# Patient Record
Sex: Female | Born: 1985 | Race: White | Hispanic: No | Marital: Married | Smoking: Never smoker
Health system: Southern US, Community
[De-identification: ages and names within clinical notes are randomized; demographics above are authoritative.]

## PROBLEM LIST (undated history)

## (undated) DIAGNOSIS — R519 Headache, unspecified: Secondary | ICD-10-CM

## (undated) DIAGNOSIS — F32A Depression, unspecified: Secondary | ICD-10-CM

## (undated) DIAGNOSIS — G43909 Migraine, unspecified, not intractable, without status migrainosus: Secondary | ICD-10-CM

## (undated) DIAGNOSIS — R87619 Unspecified abnormal cytological findings in specimens from cervix uteri: Secondary | ICD-10-CM

## (undated) DIAGNOSIS — R51 Headache: Secondary | ICD-10-CM

## (undated) DIAGNOSIS — F329 Major depressive disorder, single episode, unspecified: Secondary | ICD-10-CM

## (undated) DIAGNOSIS — F419 Anxiety disorder, unspecified: Secondary | ICD-10-CM

## (undated) DIAGNOSIS — E785 Hyperlipidemia, unspecified: Secondary | ICD-10-CM

## (undated) DIAGNOSIS — B009 Herpesviral infection, unspecified: Secondary | ICD-10-CM

## (undated) HISTORY — DX: Anxiety disorder, unspecified: F41.9

## (undated) HISTORY — DX: Headache, unspecified: R51.9

## (undated) HISTORY — DX: Hyperlipidemia, unspecified: E78.5

## (undated) HISTORY — DX: Herpesviral infection, unspecified: B00.9

## (undated) HISTORY — DX: Unspecified abnormal cytological findings in specimens from cervix uteri: R87.619

## (undated) HISTORY — PX: WISDOM TOOTH EXTRACTION: SHX21

## (undated) HISTORY — PX: TYMPANOSTOMY TUBE PLACEMENT: SHX32

## (undated) HISTORY — DX: Major depressive disorder, single episode, unspecified: F32.9

## (undated) HISTORY — DX: Headache: R51

## (undated) HISTORY — DX: Depression, unspecified: F32.A

---
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
## 2012-12-04 HISTORY — PX: FACIAL FRACTURE SURGERY: SHX1570

## 2016-07-09 ENCOUNTER — Ambulatory Visit
Admission: EM | Admit: 2016-07-09 | Discharge: 2016-07-09 | Disposition: A | Discharge status: Home / Self Care | Payer: 59 | Attending: Family Medicine | Admitting: Family Medicine

## 2016-07-09 ENCOUNTER — Ambulatory Visit (INDEPENDENT_AMBULATORY_CARE_PROVIDER_SITE_OTHER): Payer: 59

## 2016-07-09 DIAGNOSIS — S9031XA Contusion of right foot, initial encounter: Secondary | ICD-10-CM

## 2016-07-09 DIAGNOSIS — S99921A Unspecified injury of right foot, initial encounter: Secondary | ICD-10-CM | POA: Diagnosis not present

## 2016-07-09 DIAGNOSIS — S93601A Unspecified sprain of right foot, initial encounter: Secondary | ICD-10-CM | POA: Diagnosis not present

## 2016-07-09 HISTORY — DX: Migraine, unspecified, not intractable, without status migrainosus: G43.909

## 2016-07-09 MED ORDER — MELOXICAM 15 MG PO TABS: 15.0000 mg | ORAL_TABLET | Freq: Every day | ORAL | 0 refills | Status: DC

## 2016-07-09 NOTE — ED Triage Notes (Signed)
Patient states that she was outside with her dogs last night and twisted just her foot. Patient states that others around her also heard a cracking sound. Patient has swelling and bruising on her foot. Patient states that area hurts to ambulate but does improve with compression.
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
## 2016-07-24 DIAGNOSIS — H5213 Myopia, bilateral: Secondary | ICD-10-CM | POA: Diagnosis not present

## 2016-08-24 DIAGNOSIS — Z01419 Encounter for gynecological examination (general) (routine) without abnormal findings: Secondary | ICD-10-CM | POA: Diagnosis not present

## 2016-08-24 DIAGNOSIS — Z8619 Personal history of other infectious and parasitic diseases: Secondary | ICD-10-CM | POA: Diagnosis not present

## 2016-08-24 DIAGNOSIS — Z124 Encounter for screening for malignant neoplasm of cervix: Secondary | ICD-10-CM | POA: Diagnosis not present

## 2016-08-24 DIAGNOSIS — N920 Excessive and frequent menstruation with regular cycle: Secondary | ICD-10-CM | POA: Diagnosis not present

## 2016-08-25 DIAGNOSIS — Z01419 Encounter for gynecological examination (general) (routine) without abnormal findings: Secondary | ICD-10-CM | POA: Diagnosis not present

## 2016-09-06 DIAGNOSIS — M9901 Segmental and somatic dysfunction of cervical region: Secondary | ICD-10-CM | POA: Diagnosis not present

## 2016-09-06 DIAGNOSIS — M9902 Segmental and somatic dysfunction of thoracic region: Secondary | ICD-10-CM | POA: Diagnosis not present

## 2016-09-06 DIAGNOSIS — M5414 Radiculopathy, thoracic region: Secondary | ICD-10-CM | POA: Diagnosis not present

## 2016-09-06 DIAGNOSIS — G43001 Migraine without aura, not intractable, with status migrainosus: Secondary | ICD-10-CM | POA: Diagnosis not present

## 2016-09-07 DIAGNOSIS — M9901 Segmental and somatic dysfunction of cervical region: Secondary | ICD-10-CM | POA: Diagnosis not present

## 2016-09-07 DIAGNOSIS — M5414 Radiculopathy, thoracic region: Secondary | ICD-10-CM | POA: Diagnosis not present

## 2016-09-07 DIAGNOSIS — M9902 Segmental and somatic dysfunction of thoracic region: Secondary | ICD-10-CM | POA: Diagnosis not present

## 2016-09-07 DIAGNOSIS — G43001 Migraine without aura, not intractable, with status migrainosus: Secondary | ICD-10-CM | POA: Diagnosis not present

## 2016-09-11 DIAGNOSIS — M5414 Radiculopathy, thoracic region: Secondary | ICD-10-CM | POA: Diagnosis not present

## 2016-09-11 DIAGNOSIS — M9902 Segmental and somatic dysfunction of thoracic region: Secondary | ICD-10-CM | POA: Diagnosis not present

## 2016-09-11 DIAGNOSIS — M9901 Segmental and somatic dysfunction of cervical region: Secondary | ICD-10-CM | POA: Diagnosis not present

## 2016-09-11 DIAGNOSIS — G43001 Migraine without aura, not intractable, with status migrainosus: Secondary | ICD-10-CM | POA: Diagnosis not present

## 2016-09-13 DIAGNOSIS — M9901 Segmental and somatic dysfunction of cervical region: Secondary | ICD-10-CM | POA: Diagnosis not present

## 2016-09-13 DIAGNOSIS — G43001 Migraine without aura, not intractable, with status migrainosus: Secondary | ICD-10-CM | POA: Diagnosis not present

## 2016-09-13 DIAGNOSIS — M9902 Segmental and somatic dysfunction of thoracic region: Secondary | ICD-10-CM | POA: Diagnosis not present

## 2016-09-13 DIAGNOSIS — M5414 Radiculopathy, thoracic region: Secondary | ICD-10-CM | POA: Diagnosis not present

## 2016-09-19 DIAGNOSIS — M5414 Radiculopathy, thoracic region: Secondary | ICD-10-CM | POA: Diagnosis not present

## 2016-09-19 DIAGNOSIS — M9901 Segmental and somatic dysfunction of cervical region: Secondary | ICD-10-CM | POA: Diagnosis not present

## 2016-09-19 DIAGNOSIS — M9902 Segmental and somatic dysfunction of thoracic region: Secondary | ICD-10-CM | POA: Diagnosis not present

## 2016-09-19 DIAGNOSIS — G43001 Migraine without aura, not intractable, with status migrainosus: Secondary | ICD-10-CM | POA: Diagnosis not present

## 2016-09-21 DIAGNOSIS — M9901 Segmental and somatic dysfunction of cervical region: Secondary | ICD-10-CM | POA: Diagnosis not present

## 2016-09-21 DIAGNOSIS — M5414 Radiculopathy, thoracic region: Secondary | ICD-10-CM | POA: Diagnosis not present

## 2016-09-21 DIAGNOSIS — M9902 Segmental and somatic dysfunction of thoracic region: Secondary | ICD-10-CM | POA: Diagnosis not present

## 2016-09-21 DIAGNOSIS — G43001 Migraine without aura, not intractable, with status migrainosus: Secondary | ICD-10-CM | POA: Diagnosis not present

## 2016-09-25 DIAGNOSIS — M9901 Segmental and somatic dysfunction of cervical region: Secondary | ICD-10-CM | POA: Diagnosis not present

## 2016-09-25 DIAGNOSIS — M9902 Segmental and somatic dysfunction of thoracic region: Secondary | ICD-10-CM | POA: Diagnosis not present

## 2016-09-25 DIAGNOSIS — M5414 Radiculopathy, thoracic region: Secondary | ICD-10-CM | POA: Diagnosis not present

## 2016-09-25 DIAGNOSIS — G43001 Migraine without aura, not intractable, with status migrainosus: Secondary | ICD-10-CM | POA: Diagnosis not present

## 2016-09-29 DIAGNOSIS — M9902 Segmental and somatic dysfunction of thoracic region: Secondary | ICD-10-CM | POA: Diagnosis not present

## 2016-09-29 DIAGNOSIS — M5414 Radiculopathy, thoracic region: Secondary | ICD-10-CM | POA: Diagnosis not present

## 2016-09-29 DIAGNOSIS — G43001 Migraine without aura, not intractable, with status migrainosus: Secondary | ICD-10-CM | POA: Diagnosis not present

## 2016-09-29 DIAGNOSIS — M9901 Segmental and somatic dysfunction of cervical region: Secondary | ICD-10-CM | POA: Diagnosis not present

## 2016-10-02 DIAGNOSIS — G43001 Migraine without aura, not intractable, with status migrainosus: Secondary | ICD-10-CM | POA: Diagnosis not present

## 2016-10-02 DIAGNOSIS — M5414 Radiculopathy, thoracic region: Secondary | ICD-10-CM | POA: Diagnosis not present

## 2016-10-02 DIAGNOSIS — M9901 Segmental and somatic dysfunction of cervical region: Secondary | ICD-10-CM | POA: Diagnosis not present

## 2016-10-02 DIAGNOSIS — M9902 Segmental and somatic dysfunction of thoracic region: Secondary | ICD-10-CM | POA: Diagnosis not present

## 2016-10-05 DIAGNOSIS — M9902 Segmental and somatic dysfunction of thoracic region: Secondary | ICD-10-CM | POA: Diagnosis not present

## 2016-10-05 DIAGNOSIS — M5414 Radiculopathy, thoracic region: Secondary | ICD-10-CM | POA: Diagnosis not present

## 2016-10-05 DIAGNOSIS — M9901 Segmental and somatic dysfunction of cervical region: Secondary | ICD-10-CM | POA: Diagnosis not present

## 2016-10-05 DIAGNOSIS — G43001 Migraine without aura, not intractable, with status migrainosus: Secondary | ICD-10-CM | POA: Diagnosis not present

## 2016-10-12 DIAGNOSIS — G43001 Migraine without aura, not intractable, with status migrainosus: Secondary | ICD-10-CM | POA: Diagnosis not present

## 2016-10-12 DIAGNOSIS — M9902 Segmental and somatic dysfunction of thoracic region: Secondary | ICD-10-CM | POA: Diagnosis not present

## 2016-10-12 DIAGNOSIS — M5414 Radiculopathy, thoracic region: Secondary | ICD-10-CM | POA: Diagnosis not present

## 2016-10-12 DIAGNOSIS — M9901 Segmental and somatic dysfunction of cervical region: Secondary | ICD-10-CM | POA: Diagnosis not present

## 2017-04-18 DIAGNOSIS — Z8742 Personal history of other diseases of the female genital tract: Secondary | ICD-10-CM | POA: Diagnosis not present

## 2017-08-31 DIAGNOSIS — Z01419 Encounter for gynecological examination (general) (routine) without abnormal findings: Secondary | ICD-10-CM | POA: Diagnosis not present

## 2017-08-31 DIAGNOSIS — N6081 Other benign mammary dysplasias of right breast: Secondary | ICD-10-CM | POA: Diagnosis not present

## 2017-10-10 DIAGNOSIS — R7989 Other specified abnormal findings of blood chemistry: Secondary | ICD-10-CM | POA: Diagnosis not present

## 2017-10-10 DIAGNOSIS — R5383 Other fatigue: Secondary | ICD-10-CM | POA: Diagnosis not present

## 2017-10-10 DIAGNOSIS — R5381 Other malaise: Secondary | ICD-10-CM | POA: Diagnosis not present

## 2017-10-10 DIAGNOSIS — N6081 Other benign mammary dysplasias of right breast: Secondary | ICD-10-CM | POA: Diagnosis not present

## 2017-10-11 DIAGNOSIS — R7989 Other specified abnormal findings of blood chemistry: Secondary | ICD-10-CM | POA: Diagnosis not present

## 2017-10-11 DIAGNOSIS — R5383 Other fatigue: Secondary | ICD-10-CM | POA: Diagnosis not present

## 2017-10-11 DIAGNOSIS — N6081 Other benign mammary dysplasias of right breast: Secondary | ICD-10-CM | POA: Diagnosis not present

## 2017-10-11 DIAGNOSIS — R5381 Other malaise: Secondary | ICD-10-CM | POA: Diagnosis not present

## 2017-10-18 DIAGNOSIS — R768 Other specified abnormal immunological findings in serum: Secondary | ICD-10-CM | POA: Diagnosis not present

## 2017-12-06 ENCOUNTER — Other Ambulatory Visit
Admission: RE | Admit: 2017-12-06 | Discharge: 2017-12-06 | Disposition: A | Discharge status: Home / Self Care | Payer: 59 | Source: Ambulatory Visit | Attending: Internal Medicine | Admitting: Internal Medicine

## 2017-12-06 DIAGNOSIS — E039 Hypothyroidism, unspecified: Secondary | ICD-10-CM | POA: Insufficient documentation

## 2017-12-06 DIAGNOSIS — E785 Hyperlipidemia, unspecified: Secondary | ICD-10-CM | POA: Diagnosis not present

## 2017-12-06 DIAGNOSIS — R5383 Other fatigue: Secondary | ICD-10-CM | POA: Diagnosis not present

## 2017-12-06 LAB — T4, FREE: FREE T4: 0.78 ng/dL (ref 0.61–1.12)

## 2017-12-07 LAB — THYROID ANTIBODIES
Thyroglobulin Antibody: 48.9 IU/mL — ABNORMAL HIGH (ref 0.0–0.9)
Thyroperoxidase Ab SerPl-aCnc: 16 IU/mL (ref 0–34)

## 2017-12-07 LAB — TSH: TSH: 1.192 u[IU]/mL (ref 0.350–4.500)

## 2017-12-07 LAB — T3, FREE: T3 FREE: 3.6 pg/mL (ref 2.0–4.4)

## 2017-12-25 DIAGNOSIS — R5383 Other fatigue: Secondary | ICD-10-CM | POA: Diagnosis not present

## 2018-02-26 DIAGNOSIS — H5210 Myopia, unspecified eye: Secondary | ICD-10-CM | POA: Diagnosis not present

## 2018-02-26 DIAGNOSIS — H5213 Myopia, bilateral: Secondary | ICD-10-CM | POA: Diagnosis not present

## 2018-02-26 DIAGNOSIS — H1045 Other chronic allergic conjunctivitis: Secondary | ICD-10-CM | POA: Diagnosis not present

## 2018-05-14 IMAGING — CR DG FOOT COMPLETE 3+V*R*
3 series · 3 of 3 positions shown · non-contrast
Comparison: None.

CLINICAL DATA: Injury to base of fifth metatarsal bone.

EXAM:
RIGHT FOOT COMPLETE - 3+ VIEW

[foot ap]
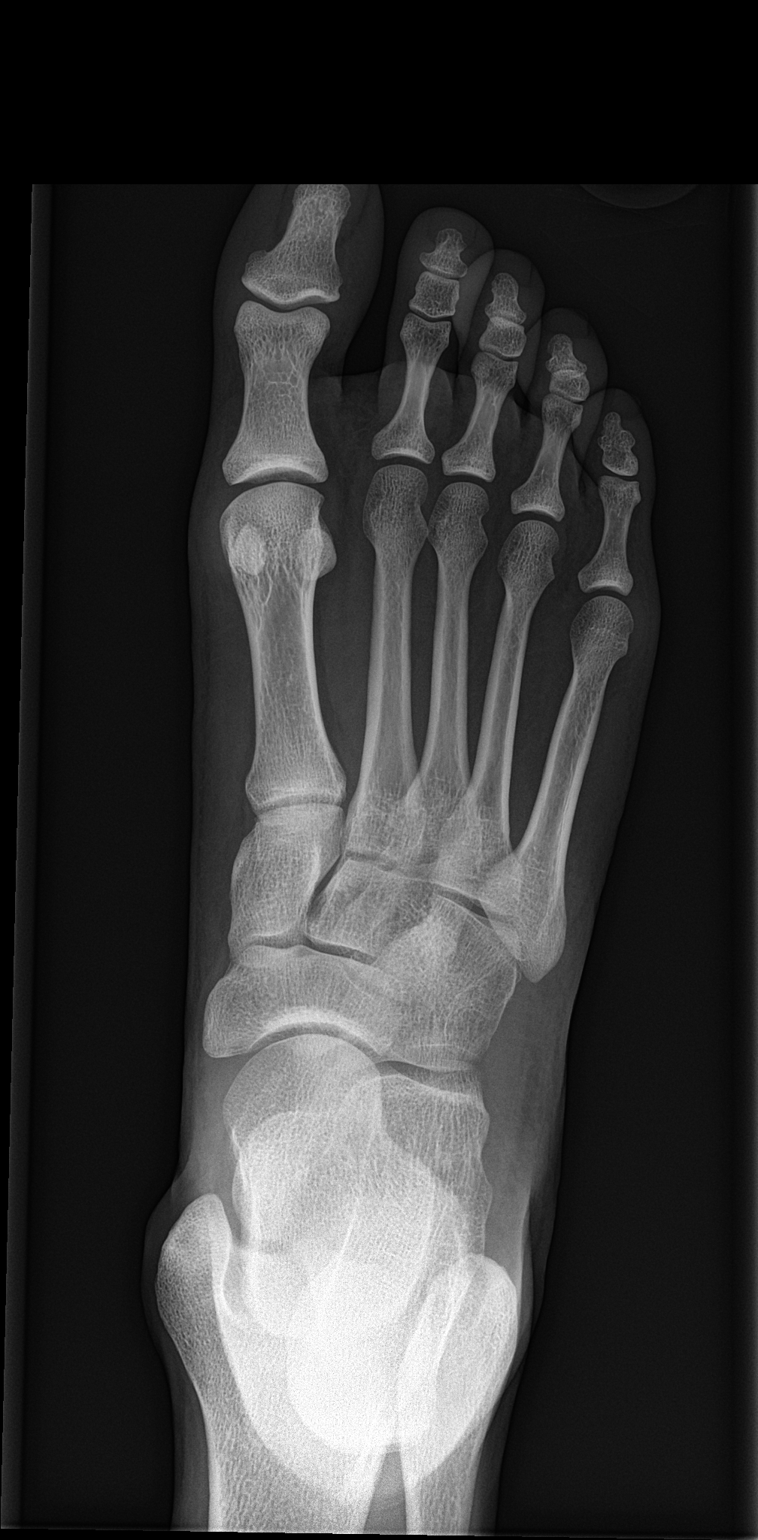

[foot obl]
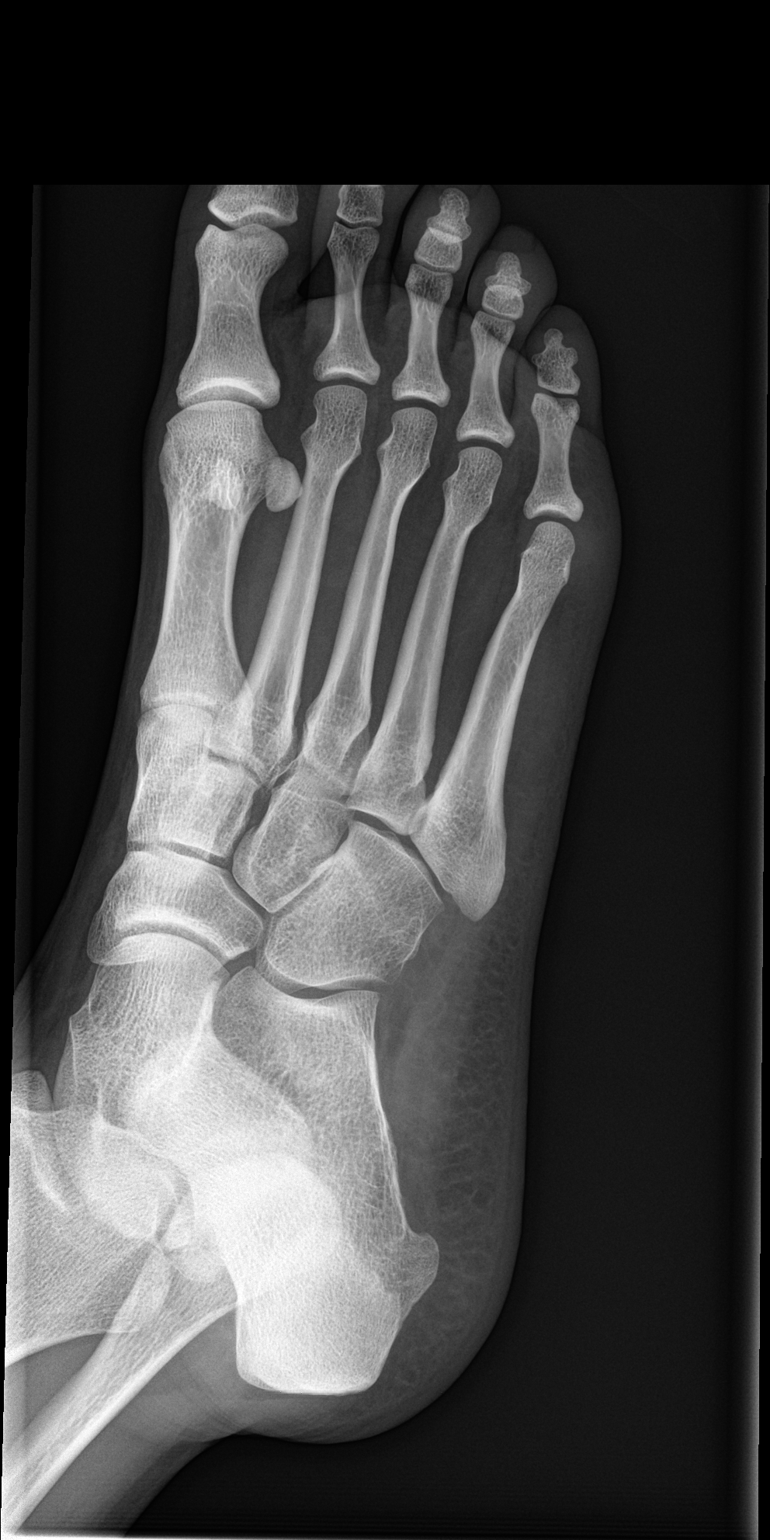

[foot lat]
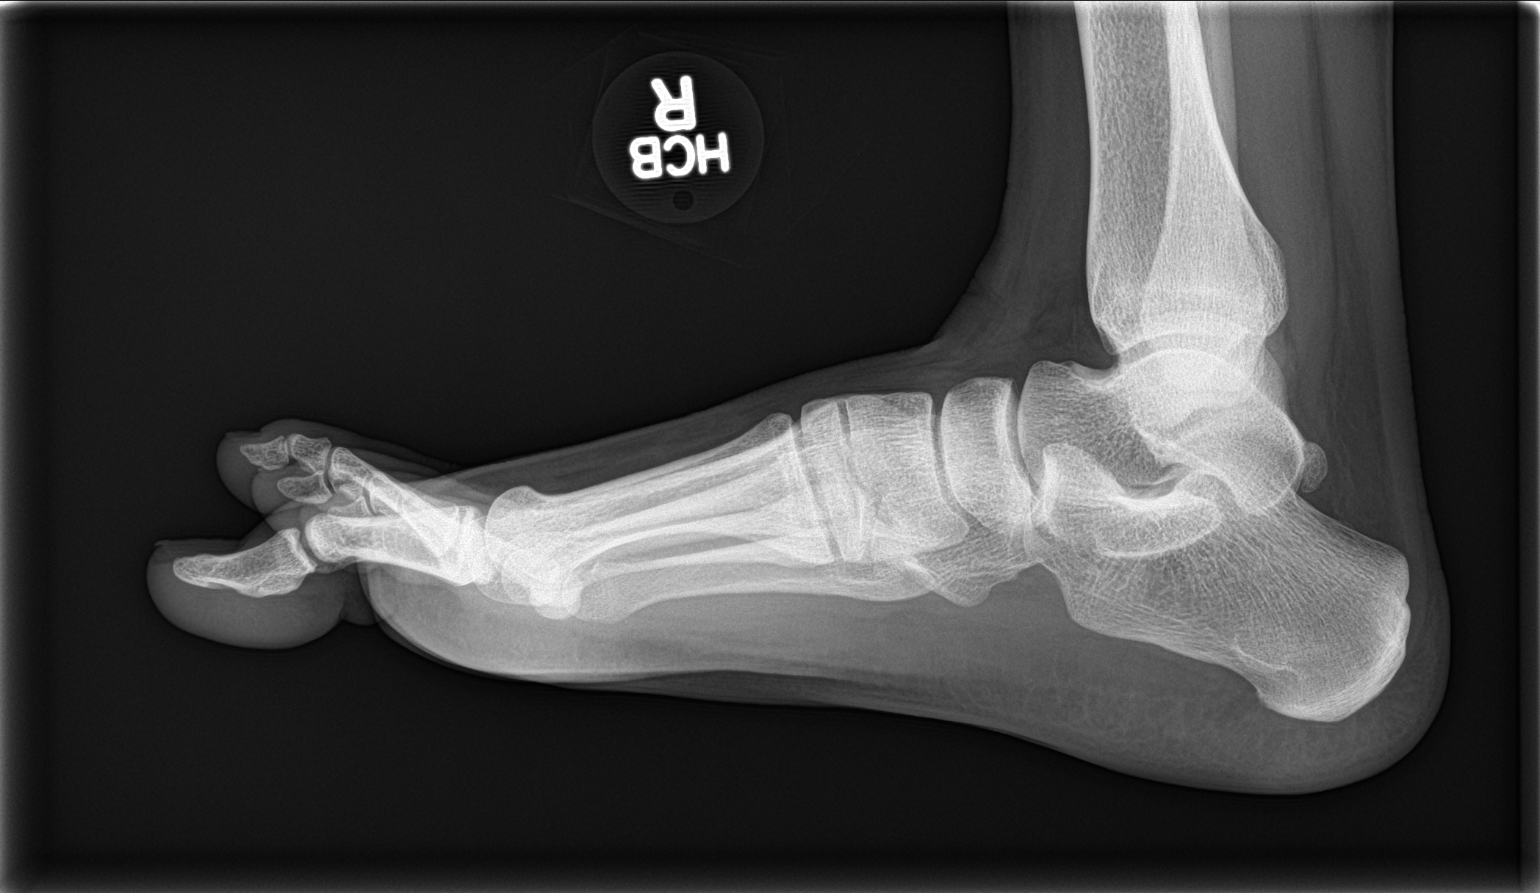

[3 of 3 positions shown; findings below may reference images not displayed]

FINDINGS: There is no evidence of fracture or dislocation. There is no
evidence of arthropathy or other focal bone abnormality. Soft
tissues are unremarkable.
IMPRESSION: Negative.

## 2018-07-16 ENCOUNTER — Ambulatory Visit (INDEPENDENT_AMBULATORY_CARE_PROVIDER_SITE_OTHER): Payer: 59

## 2018-07-16 ENCOUNTER — Other Ambulatory Visit: Payer: Self-pay

## 2018-07-16 ENCOUNTER — Ambulatory Visit
Admission: EM | Admit: 2018-07-16 | Discharge: 2018-07-16 | Disposition: A | Discharge status: Home / Self Care | Payer: 59 | Attending: Family Medicine | Admitting: Family Medicine

## 2018-07-16 ENCOUNTER — Encounter: Payer: Self-pay | Admitting: Emergency Medicine

## 2018-07-16 DIAGNOSIS — B9689 Other specified bacterial agents as the cause of diseases classified elsewhere: Secondary | ICD-10-CM

## 2018-07-16 DIAGNOSIS — N760 Acute vaginitis: Secondary | ICD-10-CM

## 2018-07-16 DIAGNOSIS — M25562 Pain in left knee: Secondary | ICD-10-CM

## 2018-07-16 DIAGNOSIS — B3731 Acute candidiasis of vulva and vagina: Secondary | ICD-10-CM

## 2018-07-16 DIAGNOSIS — B373 Candidiasis of vulva and vagina: Secondary | ICD-10-CM

## 2018-07-16 LAB — WET PREP, GENITAL
Sperm: NONE SEEN
Trich, Wet Prep: NONE SEEN

## 2018-07-16 MED ORDER — METRONIDAZOLE 500 MG PO TABS: 500.0000 mg | ORAL_TABLET | Freq: Two times a day (BID) | ORAL | 0 refills | Status: DC

## 2018-07-16 MED ORDER — MELOXICAM 15 MG PO TABS: 15.0000 mg | ORAL_TABLET | Freq: Every day | ORAL | 0 refills | Status: DC | PRN

## 2018-07-16 MED ORDER — FLUCONAZOLE 150 MG PO TABS: 150.0000 mg | ORAL_TABLET | Freq: Every day | ORAL | 0 refills | Status: DC

## 2018-07-16 NOTE — Discharge Instructions (Addendum)
Take medication as prescribed. Rest. Drink plenty of fluids. Use knee brace as discussed.   Follow up with orthopedic in one week.   Follow up with your primary care physician this week as needed. Return to Urgent care for new or worsening concerns.

## 2018-07-16 NOTE — ED Triage Notes (Signed)
Pt comes to urgent care today c/o left knee pain. Has been occurring for about 6 weeks. She has been doing more exercise and running. Her knee is swollen and hurts worse when bending her knee. Also she is having vaginal itching with white discharge. Her in law recently opened a pool and she has gotten them due to swimming and taking baths in the past. No recent antibiotic use.

## 2018-07-16 NOTE — ED Provider Notes (Signed)
[facility] ____________________________________________  Time seen: Approximately 7:45 PM  I have reviewed the triage vital signs and the nursing notes.   HISTORY  Chief Complaint Knee Pain (left) and Vaginal Itching   HPI Barbara Hunt is a 32 y.o. female presenting for evaluation of left knee pain that is been present for approximately 6 weeks.  States this has been gradual in occurrence.  States in the same timeframe she is recently started increasing her activity levels, including running more as well as participating in a boot camp.  States the [facility] involves a lot of quick movements and different activities.  States that she has continued to do these exercises at least 3 times a week as well as working her 12-hour shifts at work. Has not rested knee. States pain has been gradually worsening, and in the last several days she has had increased pain with accompanying swelling and popping sensation.  States for the last few days she has had a frequent popping sensation.  No giving out.  No direct fall, direct injury or direct trauma.  Denies abrupt onset.  States pain also present in the medial posterior knee coming around to the front right of the knee.  States pain is currently mild.  States usually first thing in the morning she has no pain, but the pain increases as she continues throughout the day.Pain worse with bending.  Unrelieved with over-the-counter Tylenol and ibuprofen.  Denies pain radiation, paresthesias, thigh pain, calf pain, lower extremity edema.  No recent immobilizations or long trips.  Non-smoker.  Not on oral contraceptives.  No previous blood clots.  Denies recurrent issues to knee in the past.  Has not been evaluated for the same.  Patient also complains of 2 days of whitish vaginal discharge with itching. No dysuria. States frequent swimming recently and current complaints feels consistent with vaginal yeast infection.  Sexually active with the same  partner, declines current pregnancy and declines concerns of STDs.  No alleviating measures attempted.  Denies other aggravating factors.  Denies chest pain, shortness of breath, abdominal pain, dysuria, or rash. Denies recent sickness. Denies recent antibiotic use.   [name], MD (Inactive): PCP Patient's last menstrual period was 06/20/2018.Denies pregnancy.   Past Medical History:  Diagnosis Date  . Migraines     There are no active problems to display for this patient.   Past Surgical History:  Procedure Laterality Date  . FACIAL FRACTURE SURGERY  12/2012  . PELVIC FRACTURE SURGERY  12/2002     No current facility-administered medications for this encounter.   Current Outpatient Medications:  .  fluconazole (DIFLUCAN) 150 MG tablet, Take 1 tablet (150 mg total) by mouth daily. Take one pill orally, then Repeat in one week as needed., Disp: 2 tablet, Rfl: 0 .  meloxicam (MOBIC) 15 MG tablet, Take 1 tablet (15 mg total) by mouth daily as needed., Disp: 10 tablet, Rfl: 0 .  metroNIDAZOLE (FLAGYL) 500 MG tablet, Take 1 tablet (500 mg total) by mouth 2 (two) times daily., Disp: 14 tablet, Rfl: 0  Allergies Patient has no known allergies.  History reviewed. No pertinent family history.  Social History Social History   Tobacco Use  . Smoking status: Never Smoker  . Smokeless tobacco: Never Used  Substance Use Topics  . Alcohol use: No  . Drug use: No    Review of Systems Constitutional: No fever/chills Cardiovascular: Denies chest pain. Respiratory: Denies shortness of breath. Gastrointestinal: No abdominal pain.   Genitourinary:  Negative for dysuria. Musculoskeletal: Negative for back pain. Knee pain. Skin: Negative for rash.  ____________________________________________   PHYSICAL EXAM:  VITAL SIGNS: ED Triage Vitals  Enc Vitals Group     BP 07/16/18 1919 119/79     Pulse Rate 07/16/18 1919 (!) 54     Resp 07/16/18 1919 16     Temp 07/16/18 1919  98.1 F (36.7 C)     Temp Source 07/16/18 1919 Oral     SpO2 07/16/18 1919 100 %     Weight 07/16/18 1918 130 lb (59 kg)     Height 07/16/18 1918 5\' 7"  (1.702 m)     Head Circumference --      Peak Flow --      Pain Score 07/16/18 1917 3     Pain Loc --      Pain Edu? --      Excl. in GC? --     Constitutional: Alert and oriented. Well appearing and in no acute distress. ENT      Head: Normocephalic and atraumatic. Cardiovascular: Normal rate, regular rhythm. Grossly normal heart sounds.  Good peripheral circulation. Respiratory: Normal respiratory effort without tachypnea nor retractions. Breath sounds are clear and equal bilaterally. No wheezes, rales, rhonchi. Gastrointestinal: Soft and nontender. Musculoskeletal:  No midline cervical, thoracic or lumbar tenderness to palpation. Bilateral pedal pulses equal and easily palpated.      Right lower leg:  No tenderness or edema.      Left lower leg:  No tenderness or edema.  Except: Right anterior knee bilateral effusion noted right greater than left, no point bony tenderness, mild pain right anterior noted inferior to patella and medial knee to medial posterior, no posterior edema, no erythema, skin intact, pain with valgus stress, minimal pain with varus stress, negative anterior posterior drawer test, mild pain with resisted knee flexion, no pain with resisted knee extension, no left lower extremity edema otherwise, left lower extremity otherwise nontender.  No pain with bilateral plantarflexion and dorsiflexion. Neurologic:  Normal speech and language. No gross focal neurologic deficits are appreciated. Speech is normal. No gait instability.  Skin:  Skin is warm, dry and intact. No rash noted. Psychiatric: Mood and affect are normal. Speech and behavior are normal. Patient exhibits appropriate insight and judgment   ___________________________________________   LABS (all labs ordered are listed, but only abnormal results are  displayed)  Labs Reviewed  WET PREP, GENITAL - Abnormal; Notable for the following components:      Result Value   Yeast Wet Prep HPF POC PRESENT (*)    Clue Cells Wet Prep HPF POC PRESENT (*)    WBC, Wet Prep HPF POC FEW (*)    All other components within normal limits   RADIOLOGY  Dg Knee Complete 4 Views Left  Result Date: 07/16/2018 CLINICAL DATA:  Pain for 6 weeks EXAM: LEFT KNEE - COMPLETE 4+ VIEW COMPARISON:  None. FINDINGS: Upright frontal, tunnel, lateral, and sunrise patellar images were obtained. No fracture or dislocation. No joint effusion. Joint spaces appear unremarkable. No erosive change. IMPRESSION: No evident fracture or joint effusion. No appreciable arthropathic change. Electronically Signed   By: Bretta BangWilliam  Woodruff III M.D.   On: 07/16/2018 20:39   ____________________________________________   PROCEDURES Procedures   INITIAL IMPRESSION / ASSESSMENT AND PLAN / ED COURSE  Pertinent labs & imaging results that were available during my care of the patient were reviewed by me and considered in my medical decision making (see chart for details).  Well-appearing patient.  No acute distress.  Left knee pain gradual in onset and worse with increased recent activity.  Suspect inflammatory cause, however with increasing pain and popping, concern for ligamentous or meniscal injury.  Left knee x-ray unremarkable.  Encourage supportive over-the-counter sleeve brace and will treat with oral daily Mobic.  Caution regarding and encourage decrease activity to allow rest.  Follow-up with orthopedic.  Also with vaginal discharge and itching, declined pelvic exam and elected for self wet prep.  Wet prep positive for yeast and clue cells.  Will treat with oral Flagyl and Diflucan.  Encourage rest, fluids, supportive care. Discussed indication, risks and benefits of medications with patient.  Discussed follow up with Primary care physician this week. Discussed follow up and return  parameters including no resolution or any worsening concerns. Patient verbalized understanding and agreed to plan.   ____________________________________________   FINAL CLINICAL IMPRESSION(S) / ED DIAGNOSES  Final diagnoses:  Acute pain of left knee  Yeast vaginitis  Bacterial vaginosis     ED Discharge Orders         Ordered    meloxicam (MOBIC) 15 MG tablet  Daily PRN     07/16/18 2046    metroNIDAZOLE (FLAGYL) 500 MG tablet  2 times daily     07/16/18 2046    fluconazole (DIFLUCAN) 150 MG tablet  Daily     07/16/18 2046           Note: This dictation was prepared with Dragon dictation along with smaller phrase technology. Any transcriptional errors that result from this process are unintentional.         Renford DillsMiller, Stefani Baik, NP 07/16/18 2048

## 2018-08-10 ENCOUNTER — Ambulatory Visit
Admission: EM | Admit: 2018-08-10 | Discharge: 2018-08-10 | Disposition: A | Discharge status: Home / Self Care | Payer: 59 | Attending: Family Medicine | Admitting: Family Medicine

## 2018-08-10 ENCOUNTER — Other Ambulatory Visit: Payer: Self-pay

## 2018-08-10 DIAGNOSIS — H6981 Other specified disorders of Eustachian tube, right ear: Secondary | ICD-10-CM | POA: Diagnosis not present

## 2018-08-10 DIAGNOSIS — H9311 Tinnitus, right ear: Secondary | ICD-10-CM | POA: Diagnosis not present

## 2018-08-10 NOTE — ED Provider Notes (Signed)
[facility]    CSN: [id number] Arrival date & time: 08/10/18  0831     History   Chief Complaint Chief Complaint  Patient presents with  . Tinnitus    HPI Barbara Hunt is a 32 y.o. female.   HPI  -year-old female presents right ear tinnitus that she has had constantly for 2 weeks.  Not have any pain.  She denies any hearing loss , dizziness, or drainage.  Does not interfere with her sleep.  No tenderness about the ear.  This the first time that she relates that migraines has caused a problem ears.  Had migraines since fourth grade.  She has been evaluated by a specialist in her teenage years but not since that time.  Having between 1 and 2 a month.Only  Thing that helps with the migraines is sleep.  She has been placed on prophylactic medications which have not worked.  Routine migraine abortive drugs have not also been effective for her.        Past Medical History:  Diagnosis Date  . Migraines     There are no active problems to display for this patient.   Past Surgical History:  Procedure Laterality Date  . FACIAL FRACTURE SURGERY  12/2012  . PELVIC FRACTURE SURGERY  12/2002    OB History   None      Home Medications    Prior to Admission medications   Not on File    Family History History reviewed. No pertinent family history.  Social History Social History   Tobacco Use  . Smoking status: Never Smoker  . Smokeless tobacco: Never Used  Substance Use Topics  . Alcohol use: No  . Drug use: No     Allergies   Patient has no known allergies.   Review of Systems Review of Systems  Constitutional: Negative for activity change, appetite change, chills, fatigue and fever.  HENT: Positive for tinnitus. Negative for ear discharge and ear pain.   All other systems reviewed and are negative.    Physical Exam Triage Vital Signs ED Triage Vitals  Enc Vitals Group     BP 08/10/18 0842 124/78     Pulse Rate 08/10/18 0842 63   Resp 08/10/18 0842 16     Temp 08/10/18 0842 97.9 F (36.6 C)     Temp Source 08/10/18 0842 Oral     SpO2 08/10/18 0842 100 %     Weight 08/10/18 0843 130 lb (59 kg)     Height 08/10/18 0843 5' 7" (1.702 m)     Head Circumference --      Peak Flow --      Pain Score 08/10/18 0843 0     Pain Loc --      Pain Edu? --      Excl. in [place]? --    No data found.  Updated Vital Signs BP 124/78 (BP Location: Left Arm)   Pulse 63   Temp 97.9 F (36.6 C) (Oral)   Resp 16   Ht 5' 7" (1.702 m)   Wt 130 lb (59 kg)   LMP 07/24/2018   SpO2 100%   BMI 20.36 kg/m   Visual Acuity Right Eye Distance:   Left Eye Distance:   Bilateral Distance:    Right Eye Near:   Left Eye Near:    Bilateral Near:     Physical Exam  Constitutional: She is oriented to person, place, and time. She appears well-developed and well-nourished. No  distress.  HENT:  Head: Normocephalic.  Right Ear: External ear normal.  Left Ear: External ear normal.  Nose: Nose normal.  Mouth/Throat: Oropharynx is clear and moist. No oropharyngeal exudate.  Exam of the left ear shows the canal to be partially occluded with cerumen.  Right ear canal is clear.  The TM has a light reflex but the drum itself is cloudy with fluid level present.  Eyes: Pupils are equal, round, and reactive to light. Right eye exhibits no discharge. Left eye exhibits no discharge.  Neck: Normal range of motion.  Musculoskeletal: Normal range of motion.  Neurological: She is alert and oriented to person, place, and time.  Skin: Skin is warm and dry. She is not diaphoretic.  Psychiatric: She has a normal mood and affect. Her behavior is normal. Judgment and thought content normal.  Nursing note and vitals reviewed.    UC Treatments / Results  Labs (all labs ordered are listed, but only abnormal results are displayed) Labs Reviewed - No data to display  EKG None  Radiology No results found.  Procedures Procedures (including critical care  time)  Medications Ordered in UC Medications - No data to display  Initial Impression / Assessment and Plan / UC Course  I have reviewed the triage vital signs and the nursing notes.  Pertinent labs & imaging results that were available during my care of the patient were reviewed by me and considered in my medical decision making (see chart for details).     Plan: 1. Test/x-ray results and diagnosis reviewed with patient 2. rx as per orders; risks, benefits, potential side effects reviewed with patient 3. Recommend supportive treatment with Debrox cleaning kit for the left ear.  Directions were is given to the patient.  For the tinnitus I have recommended the use of Flonase nasal spray for 2 weeks.  If this does not improve over that period of time she should follow-up with ear nose and throat specialist for further evaluation and care.  I have also provided her with the name of a headache specialist in [place] that she may want to follow-up with regarding her migraines 4. F/u prn if symptoms worsen or don't improve  Final Clinical Impressions(s) / UC Diagnoses   Final diagnoses:  Tinnitus of right ear  Eustachian tube dysfunction, right     Discharge Instructions     Use Flonase daily for the next 2 to 3 weeks.  If you are not improving recommend following up with ear nose and throat specialist for further evaluation.    ED Prescriptions    None     Controlled Substance Prescriptions [place] Controlled Substance Registry consulted? Not Applicable   Lorin Picket, PA-C 08/10/18 4718

## 2018-08-10 NOTE — Discharge Instructions (Addendum)
Use Flonase daily for the next 2 to 3 weeks.  If you are not improving recommend following up with ear nose and throat specialist for further evaluation.

## 2018-08-10 NOTE — ED Triage Notes (Signed)
Pt reports after she had a migraine, her right ear has been ringing constantly x past almost 2 weeks. Denies headache now. Denies pain

## 2018-08-28 ENCOUNTER — Ambulatory Visit (INDEPENDENT_AMBULATORY_CARE_PROVIDER_SITE_OTHER): Payer: 59 | Admitting: Nurse Practitioner

## 2018-08-28 ENCOUNTER — Other Ambulatory Visit: Payer: Self-pay

## 2018-08-28 ENCOUNTER — Encounter: Payer: Self-pay | Admitting: Nurse Practitioner

## 2018-08-28 VITALS — BP 118/66 | HR 67 | Temp 97.8°F | Ht 65.5 in | Wt 130.4 lb

## 2018-08-28 DIAGNOSIS — Z23 Encounter for immunization: Secondary | ICD-10-CM | POA: Diagnosis not present

## 2018-08-28 DIAGNOSIS — E785 Hyperlipidemia, unspecified: Secondary | ICD-10-CM

## 2018-08-28 DIAGNOSIS — G43119 Migraine with aura, intractable, without status migrainosus: Secondary | ICD-10-CM | POA: Diagnosis not present

## 2018-08-28 DIAGNOSIS — Z3009 Encounter for other general counseling and advice on contraception: Secondary | ICD-10-CM | POA: Diagnosis not present

## 2018-08-28 DIAGNOSIS — Z7689 Persons encountering health services in other specified circumstances: Secondary | ICD-10-CM | POA: Diagnosis not present

## 2018-08-28 MED ORDER — NORGESTIM-ETH ESTRAD TRIPHASIC 0.18/0.215/0.25 MG-25 MCG PO TABS: 1.0000 | ORAL_TABLET | Freq: Every day | ORAL | 11 refills | Status: AC

## 2018-08-28 MED ORDER — BUTALBITAL-APAP-CAFFEINE 50-325-40 MG PO TABS
1.0000 | ORAL_TABLET | Freq: Four times a day (QID) | ORAL | 0 refills | Status: AC | PRN
Start: 2018-08-28 — End: 2019-08-28

## 2018-08-28 NOTE — Progress Notes (Signed)
Subjective:    Patient ID: Barbara Hunt, female    DOB: 02-09-1986, 32 y.o.   MRN: [id number]  Barbara Hunt is a 32 y.o. female presenting on 08/28/2018 for Establish Care (chronic recurrent migrianes )   HPI Establish Care New Provider Pt last seen by PCP many years ago.  Obtain records from recent preventative care at [facility].    Migraine Monthly migraines.  Patient started Feb tracking migraines and stopped birth control to help, but no change.  Has 1-2 headache days per month with migraine.  Is missing work days when having a migraine.  Is having aura with blurry/tunnel vision.  Onsided weakness/numbness, nausea,  - Has tried medications/testing in past without success.  Occasionally these meds make nausea worse.  Has been > 10 years since tried these.  - Has taken propranolol in last 1 year for anxiety, no change to migraines.   - Contraception - would like to restart.   Was on triphasic, changed to higher estrogen monophasic.  Was having usual period, lighter no spotting, but patient feels like she wasn't having as many migraines on triphasic. Patient's last menstrual period was 08/19/2018. Intercourse since, but used condom. - Migraines are usually triggered by time change, cold weather, extreme heat.   - Takes 2-3 days for recovery.  Has slept 30 hrs with migraine in past.  Works to sleep enough, eat regularly.  Hyperlipidemia Patient has had elevated LDL and total cholesterol in past.  No known fam history.  Over last 3 months, has been very intentional about increasing activity and eating healthier with more vegetables, less refined carbohydrate.  Would like to recheck to see if efforts are helping.  Past Medical History:  Diagnosis Date  . Abnormal Pap smear of cervix    most recent 2 pap smears normal (08/2018)  . Anxiety   . Depression   . Frequent headaches   . HSV (herpes simplex virus) infection   . Hyperlipidemia   . Migraines    Past Surgical History:    Procedure Laterality Date  . FACIAL FRACTURE SURGERY  12/2012  . PELVIC FRACTURE SURGERY  12/2002  . TYMPANOSTOMY TUBE PLACEMENT     childhood x 2  - have been removed at this time.  . WISDOM TOOTH EXTRACTION     Social History   Socioeconomic History  . Marital status: Married    Spouse name: Not on file  . Number of children: 0  . Years of education: Not on file  . Highest education level: Bachelor's degree (e.g., BA, AB, BS)  Occupational History  . Occupation: Registered Academic librarian: [employer]    Comment: [employer] mother-baby  Social Needs  . Financial resource strain: Not hard at all  . Food insecurity:    Worry: Never true    Inability: Never true  . Transportation needs:    Medical: No    Non-medical: No  Tobacco Use  . Smoking status: Never Smoker  . Smokeless tobacco: Never Used  Substance and Sexual Activity  . Alcohol use: No  . Drug use: No  . Sexual activity: Yes    Birth control/protection: Condom  Lifestyle  . Physical activity:    Days per week: 4 days    Minutes per session: 50 min  . Stress: Only a little  Relationships  . Social connections:    Talks on phone: Not on file    Gets together: Not on file    Attends  religious service: Not on file    Active member of club or organization: Not on file    Attends meetings of clubs or organizations: Not on file    Relationship status: Not on file  . Intimate partner violence:    Fear of current or ex partner: No    Emotionally abused: No    Physically abused: No    Forced sexual activity: No  Other Topics Concern  . Not on file  Social History Narrative  . Not on file   Family History  Problem Relation Age of Onset  . Alcohol abuse Mother   . Depression Mother   . Bipolar disorder Mother   . Mental illness Mother   . Heart disease Maternal Grandmother   . Heart disease Paternal Grandmother   . Stroke Paternal Grandmother   . Thyroid disease Paternal Grandmother   . Heart disease  Paternal Grandfather   . Thyroid disease Sister    Current Outpatient Medications on File Prior to Visit  Medication Sig  . fluticasone (FLONASE) 50 MCG/ACT nasal spray Place into both nostrils daily.  Marland Kitchen ibuprofen (ADVIL,MOTRIN) 200 MG tablet Take 200 mg by mouth every 6 (six) hours as needed.  . Prenatal Vit-Fe Fumarate-FA (MULTIVITAMIN-PRENATAL) 27-0.8 MG TABS tablet Take 1 tablet by mouth daily at 12 noon.   No current facility-administered medications on file prior to visit.     Review of Systems  Constitutional: Negative for chills and fever.  HENT: Negative for congestion and sore throat.   Eyes: Negative for pain.  Respiratory: Negative for cough, shortness of breath and wheezing.   Cardiovascular: Negative for chest pain, palpitations and leg swelling.  Gastrointestinal: Negative for abdominal pain, blood in stool, constipation, diarrhea, nausea and vomiting.  Endocrine: Negative for polydipsia.  Genitourinary: Negative for dysuria, frequency, hematuria and urgency.  Musculoskeletal: Negative for back pain, myalgias and neck pain.  Skin: Negative.  Negative for rash.  Allergic/Immunologic: Negative for environmental allergies.  Neurological: Positive for headaches. Negative for dizziness and weakness.  Hematological: Does not bruise/bleed easily.  Psychiatric/Behavioral: Negative for dysphoric mood and suicidal ideas. The patient is not nervous/anxious.    Per HPI unless specifically indicated above     Objective:    BP 118/66 (BP Location: Right Arm, Patient Position: Sitting, Cuff Size: Normal)   Pulse 67   Temp 97.8 F (36.6 C) (Oral)   Ht 5' 5.5" (1.664 m)   Wt 130 lb 6.4 oz (59.1 kg)   LMP 08/19/2018   BMI 21.37 kg/m   Wt Readings from Last 3 Encounters:  08/28/18 130 lb 6.4 oz (59.1 kg)  08/10/18 130 lb (59 kg)  07/16/18 130 lb (59 kg)    Physical Exam  Constitutional: She is oriented to person, place, and time. She appears well-developed and  well-nourished. No distress.  HENT:  Head: Normocephalic and atraumatic.  Cardiovascular: Normal rate, regular rhythm, S1 normal, S2 normal, normal heart sounds and intact distal pulses.  Pulmonary/Chest: Effort normal and breath sounds normal. No respiratory distress.  Neurological: She is alert and oriented to person, place, and time. She has normal strength and normal reflexes. No cranial nerve deficit or sensory deficit. She displays a negative Romberg sign. Gait normal.  Skin: Skin is warm and dry.  Psychiatric: She has a normal mood and affect. Her behavior is normal.  Vitals reviewed.  Results for orders placed or performed during the hospital encounter of 07/16/18  Wet prep, genital  Result Value Ref Range  Yeast Wet Prep HPF POC PRESENT (A) NONE SEEN   Trich, Wet Prep NONE SEEN NONE SEEN   Clue Cells Wet Prep HPF POC PRESENT (A) NONE SEEN   WBC, Wet Prep HPF POC FEW (A) NONE SEEN   Sperm NONE SEEN       Assessment & Plan:   Problem List Items Addressed This Visit    None    Visit Diagnoses    Encounter to establish care    -  Primary Previous preventative care at [facility].  Records are reviewed in CareEverywhere.  Past medical, family, and surgical history reviewed w/ patient in clinic.     Intractable migraine with aura without status migrainosus     No acute migraine headache.  Symptoms most consistent with migraine and occur 1-2 times per month with 2-3 day recovery period, prevents functioning at work.  No recent Rx abortive therapy tried.    Plan: 1. Continue ibuprofen 400-800 mg q8 hr prn 2. May start Fioricet.  Take 1 tab at onest of headache aura, repeat every 6 hours prn. If 1 tab not effective, may take 2 tabs at next headache or next dose. 3. Resume contraception (triphasic) 4. Continue headache diary 5. Followup 3 months prn.    Relevant Medications   ibuprofen (ADVIL,MOTRIN) 200 MG tablet   butalbital-acetaminophen-caffeine (FIORICET, ESGIC) 50-325-40  MG tablet   Norgestimate-Ethinyl Estradiol Triphasic 0.18/0.215/0.25 MG-25 MCG tab   Dyslipidemia     Mild dyslipidemia in past, childbearing age so not candidate for statin and LDL not high enough to recommend as benefits do not outweigh risks.  Recheck lipid panel.  Continue lifestyle modification and management.  Encouraged low glycemic diet. Follow-up 6 months.   Relevant Orders   Lipid panel   Encounter for counseling regarding contraception     Patient desires to resume contraception as her husband is no home regularly.  Patient believes triphasic contraception was more effective at helping prevent migraines then her monophasic higher dose estrogen contraception that was last used.  No current concerns the patient is pregnant.  Last sexual activity very close to menstrual cycle.  Plan: 1.  Start Ortho Tri-Cyclen lo.  If spotting occurs may need to try standard Ortho Tri-Cyclen. 2.  Encouraged patient only to start birth control pill after the start of her next period. 3.  Follow-up in 3 months with headache and as needed with Dr. Feliberto Gottron   Relevant Medications   Norgestimate-Ethinyl Estradiol Triphasic 0.18/0.215/0.25 MG-25 MCG tab   Needs flu shot     Pt < age 39.  Needs annual influenza vaccine.  Plan: 1. Administer Quad flu vaccine.   Relevant Orders   Flu Vaccine QUAD 6+ mos PF IM (Fluarix Quad PF) (Completed)      Meds ordered this encounter  Medications  . butalbital-acetaminophen-caffeine (FIORICET, ESGIC) 50-325-40 MG tablet    Sig: Take 1-2 tablets by mouth every 6 (six) hours as needed for headache.    Dispense:  20 tablet    Refill:  0    Order Specific Question:   Supervising Provider    Answer:   Smitty Cords [[id number]]  . Norgestimate-Ethinyl Estradiol Triphasic 0.18/0.215/0.25 MG-25 MCG tab    Sig: Take 1 tablet by mouth daily.    Dispense:  1 Package    Refill:  11    Order Specific Question:   Supervising Provider    Answer:   Smitty Cords [[id number]]     Follow up plan: Return 3  months if symptoms worsen or fail to improve for migraines.  Wilhelmina Mcardle, DNP, AGPCNP-BC Adult Gerontology Primary Care Nurse Practitioner [facility] 08/28/2018, 9:29 AM

## 2018-08-28 NOTE — Patient Instructions (Addendum)
Barbara Hunt "[place]",   Thank you for coming in to clinic today.  1. Try Fioricet next migraine at start of aura.  Take 1 tablet for the first day.  If this is not helpful, then take 2 tablets.  - Repeat every 6 hours for max 4 doses in 24 hours.  2. Restart your Ortho tri cyclen Lo.  If you have spotting, may need standard ortho tri cyclen.  3. Keep headache diary.  4. Cholesterol:  - You will be due for FASTING BLOOD WORK.  This means you should eat no food or drink after midnight.  Drink only water or coffee without cream/sugar on the morning of your lab visit. - Please go ahead and schedule a "Lab Only" visit in the morning at the clinic for lab draw in the next 7 days. - Your results will be available about 2-3 days after blood draw.  If you have set up a MyChart account, you can can log in to MyChart online to view your results and a brief explanation. Also, we can discuss your results together at your next office visit if you would like.   Please schedule a follow-up appointment with Wilhelmina Mcardle, AGNP. Return 3 months if symptoms worsen or fail to improve for migraines.  If you have any other questions or concerns, please feel free to call the clinic or send a message through MyChart. You may also schedule an earlier appointment if necessary.  You will receive a survey after today's visit either digitally by e-mail or paper by [employer]. Your experiences and feedback matter to [place].  Please respond so we know how we are doing as we provide care for you.   Wilhelmina Mcardle, DNP, AGNP-BC Adult Gerontology Nurse Practitioner [facility], [facility]

## 2018-08-29 ENCOUNTER — Encounter: Payer: Self-pay | Admitting: Nurse Practitioner

## 2018-08-30 DIAGNOSIS — E785 Hyperlipidemia, unspecified: Secondary | ICD-10-CM | POA: Diagnosis not present

## 2018-08-30 LAB — LIPID PANEL
Cholesterol: 171 mg/dL (ref <200)
HDL: 55 mg/dL (ref >50)
LDL Cholesterol (Calc): 100 mg/dL (calc) — ABNORMAL HIGH
Non-HDL Cholesterol (Calc): 116 mg/dL (calc) (ref <130)
Total CHOL/HDL Ratio: 3.1 (calc) (ref <5.0)
Triglycerides: 71 mg/dL (ref <150)

## 2018-10-13 ENCOUNTER — Telehealth: Payer: 59 | Admitting: Family

## 2018-10-13 DIAGNOSIS — R6889 Other general symptoms and signs: Secondary | ICD-10-CM

## 2018-10-13 MED ORDER — OSELTAMIVIR PHOSPHATE 75 MG PO CAPS: 75.0000 mg | ORAL_CAPSULE | Freq: Two times a day (BID) | ORAL | 0 refills | Status: AC

## 2018-10-13 NOTE — Progress Notes (Signed)

## 2018-10-14 ENCOUNTER — Other Ambulatory Visit: Payer: Self-pay

## 2018-10-14 ENCOUNTER — Ambulatory Visit (INDEPENDENT_AMBULATORY_CARE_PROVIDER_SITE_OTHER): Payer: 59 | Admitting: Nurse Practitioner

## 2018-10-14 ENCOUNTER — Encounter: Payer: Self-pay | Admitting: Nurse Practitioner

## 2018-10-14 VITALS — BP 112/66 | HR 80 | Temp 97.9°F | Resp 17 | Ht 65.5 in | Wt 131.0 lb

## 2018-10-14 DIAGNOSIS — R509 Fever, unspecified: Secondary | ICD-10-CM | POA: Diagnosis not present

## 2018-10-14 LAB — POCT INFLUENZA A/B
Influenza A, POC: NEGATIVE
Influenza B, POC: NEGATIVE

## 2018-10-14 NOTE — Progress Notes (Signed)
Subjective:    Patient ID: Barbara Hunt, female    DOB: [birth year], 32 y.o.   MRN: [id number]  Barbara Hunt is a 32 y.o. female presenting on 10/14/2018 for Influenza (fever 102.1 , chills, bodyaches x 3 days. Pt currently alternating tylenol & Ibuprofen )   HPI Flu-like symptoms Evisit and dx with flu.  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  She did not start the tamiflu provided by Evisit because she isn't convinced her symptoms are from flu.  - Patient has had symptoms of fever 102.1, chills, sweats, bodyaches over last 3 days. - Is taking Tylenol/Ibuprofen for controlling fevers.  Is responsive.  Suddenly Friday pm felt very tired, sleeping since Friday afternoon and over the weekend.  Has had severe chills.   - No sore throat, mild cough today, no rhinorrhea, no sinus pressure, occasional headaches. Denies any nausea, vomiting, or diarrhea. Denies any abdominal pain, pelvic pain/pressure, dysuria.  Denies confusion, seizure, loss/changes in vision, pain with moving head/neck different from normal chronic neck pain,  - No sick contacts outside of work. - Received the flu vaccine today.   Social History   Tobacco Use  . Smoking status: Never Smoker  . Smokeless tobacco: Never Used  Substance Use Topics  . Alcohol use: No  . Drug use: No    Review of Systems Per HPI unless specifically indicated above     Objective:    BP 112/66 (BP Location: Right Arm, Patient Position: Sitting, Cuff Size: Normal)   Pulse 80   Temp 97.9 F (36.6 C) (Oral)   Resp 17   Ht 5' 5.5" (1.664 m)   Wt 131 lb (59.4 kg)   SpO2 100%   BMI 21.47 kg/m   Wt Readings from Last 3 Encounters:  10/14/18 131 lb (59.4 kg)  08/28/18 130 lb 6.4 oz (59.1 kg)  08/10/18 130 lb (59 kg)    Physical Exam  Constitutional: She appears well-developed and well-nourished. She has a sickly appearance. No distress.  HENT:  Head: Normocephalic and atraumatic.  Right Ear: Hearing, tympanic  membrane, external ear and ear canal normal.  Left Ear: Hearing, tympanic membrane, external ear and ear canal normal.  Nose: Mucosal edema and rhinorrhea present. Right sinus exhibits maxillary sinus tenderness and frontal sinus tenderness. Left sinus exhibits maxillary sinus tenderness and frontal sinus tenderness.  Mouth/Throat: Uvula is midline and mucous membranes are normal. Posterior oropharyngeal erythema (injected) present. No oropharyngeal exudate or posterior oropharyngeal edema. Tonsils are 1+ on the right. Tonsils are 1+ on the left.  Eyes: Pupils are equal, round, and reactive to light. Conjunctivae and EOM are normal. Right eye exhibits no discharge. Left eye exhibits no discharge.  Neck: Normal range of motion. Neck supple.  Cardiovascular: Normal rate, regular rhythm, S1 normal, S2 normal, normal heart sounds and intact distal pulses.  Pulmonary/Chest: Effort normal and breath sounds normal. No respiratory distress.  Lymphadenopathy:    She has cervical adenopathy.  Neurological: She is alert.  Skin: Skin is warm and dry. Capillary refill takes less than 2 seconds.  Psychiatric: She has a normal mood and affect. Her behavior is normal.  Vitals reviewed.    Results for orders placed or performed in visit on 10/14/18  POCT Influenza A/B  Result Value Ref Range   Influenza A, POC Negative Negative   Influenza B, POC Negative Negative      Assessment & Plan:   Problem List Items Addressed This Visit  None    Visit Diagnoses    Fever and chills    -  Primary   Relevant Orders   POCT Influenza A/B (Completed)     Clinically diagnosed influenza despite negative rapid flu test today, concern for flu still due to symptoms. - Duration x 3 days, without complication. Tolerating PO and well hydrated.  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 - No other focal findings of infection today - S/p influenza vaccine this season - No other signs and symptoms suggestive of  alternative diagnosis.  Plan: 1. Too late to start Tamiflu or Xofluza 2. Supportive care as advised with NSAID / Tylenol PRN fever/myalgias, improve hydration, may take OTC Cold/Flu meds 3. Return criteria given if significant worsening, consider post-influenza complications, otherwise follow-up if needed     Follow up plan: Return 1-2 weeks if symptoms worsen or fail to improve.  Wilhelmina Mcardle, DNP, AGPCNP-BC Adult Gerontology Primary Care Nurse Practitioner [facility] 10/14/2018, 10:07 AM

## 2018-10-14 NOTE — Patient Instructions (Addendum)
Barbara Hunt "[place]",   Thank you for coming in to clinic today.  1. Your flu test was NEGATIVE.  It is possible you can still have the flu with a negative test, otherwise it could be a different virus causing your symptoms. - For symptom control:      - Take Ibuprofen / Advil 400-600mg  every 6-8 hours as needed for fever / muscle aches.  You may also take Tylenol 500-1000mg  per dose every 6-8 hours or 3 times a day.  You can alternate dosing and take both in the same day.      - Do not take more than 3,000 mg acetaminophen (Tylenol) in a day.      - Start Tessalon perles one every 8 hours or 3 times a day as needed for cough.      - Start Atrovent nasal spray decongestant 2 sprays in each nostril up to 4 times daily for 7 days.      - Start OTC Mucinex-DM for cough and congestion for up to 7 days. - Improve hydration with plenty of clear fluids.  Drink up to 8 glasses of water / fluids each day.  If significant worsening with poor fluid intake, worsening fever, difficulty breathing due to coughing, worsening body aches, weakness, or other more concerning symptoms difficulty breathing you can seek treatment at Emergency Department. If your flu symptoms have improved and then get worse several days to a week later with concerns for bronchitis, productive cough, fever, and chills we may need to check for possible pneumonia that can occur after the flu.  Please schedule a follow-up appointment with Wilhelmina Mcardle, AGNP. Return 1-2 weeks if symptoms worsen or fail to improve.  If you have any other questions or concerns, please feel free to call the clinic or send a message through MyChart. You may also schedule an earlier appointment if necessary.  You will receive a survey after today's visit either digitally by e-mail or paper by [employer]. Your experiences and feedback matter to [place].  Please respond so we know how we are doing as we provide care for you.   Wilhelmina Mcardle, DNP, AGNP-BC Adult  Gerontology Nurse Practitioner [facility], [facility]

## 2018-10-21 ENCOUNTER — Encounter: Payer: Self-pay | Admitting: Nurse Practitioner

## 2018-10-24 DIAGNOSIS — Z3009 Encounter for other general counseling and advice on contraception: Secondary | ICD-10-CM | POA: Diagnosis not present

## 2018-10-24 DIAGNOSIS — G43109 Migraine with aura, not intractable, without status migrainosus: Secondary | ICD-10-CM | POA: Diagnosis not present

## 2018-10-24 DIAGNOSIS — Z01419 Encounter for gynecological examination (general) (routine) without abnormal findings: Secondary | ICD-10-CM | POA: Diagnosis not present

## 2018-10-24 DIAGNOSIS — L910 Hypertrophic scar: Secondary | ICD-10-CM | POA: Diagnosis not present

## 2020-05-20 IMAGING — CR DG KNEE COMPLETE 4+V*L*
4 series · 4 of 4 positions shown · non-contrast
Comparison: None.

CLINICAL DATA: Pain for 6 weeks

EXAM:
LEFT KNEE - COMPLETE 4+ VIEW

[knee ap]
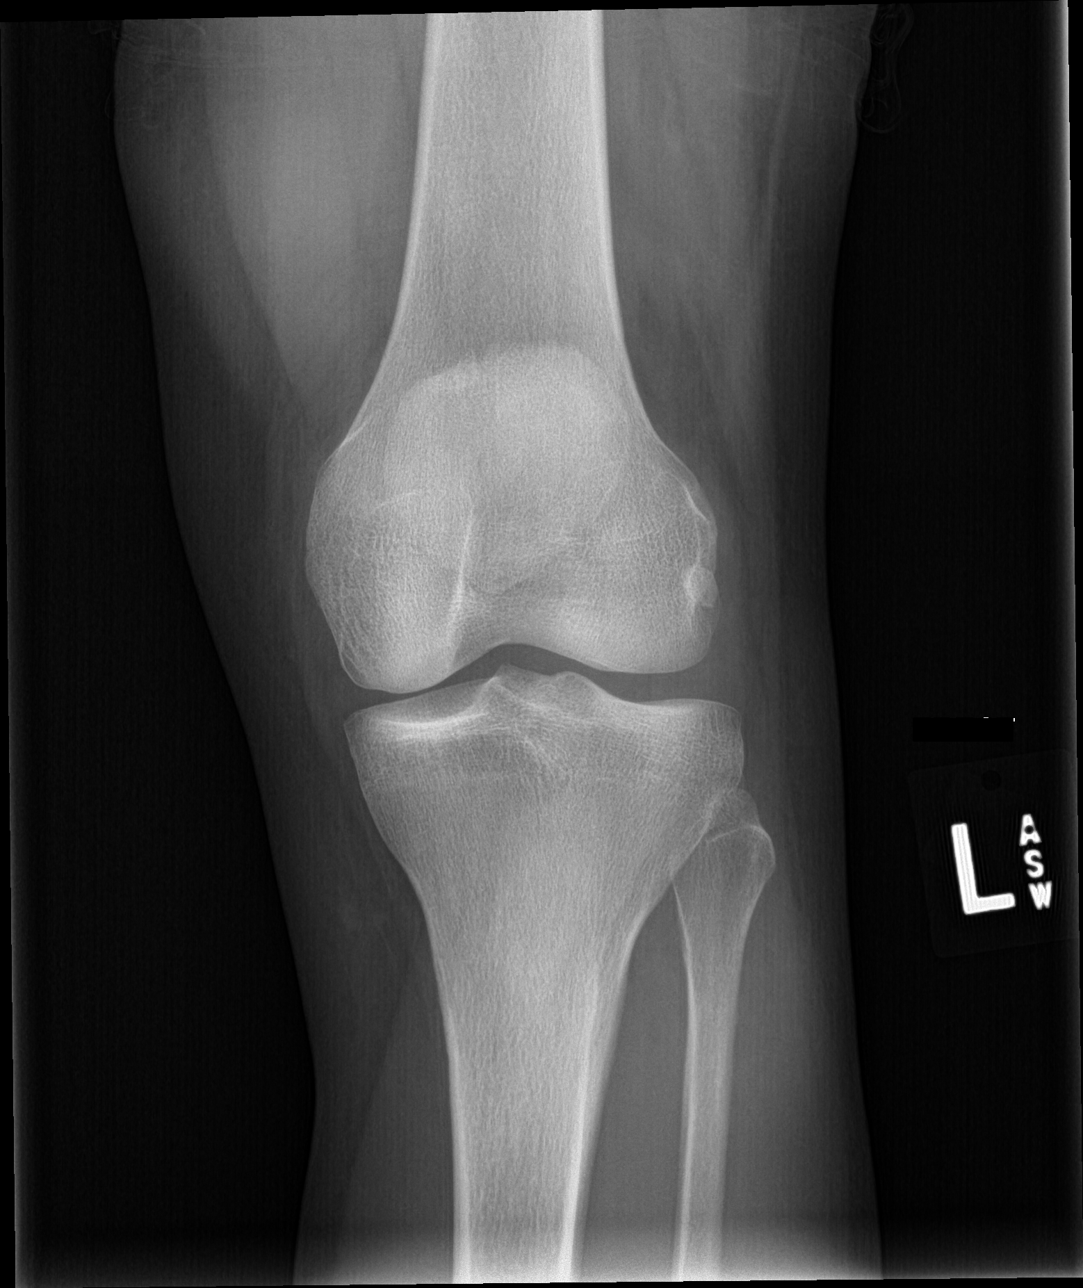

[tunnel]
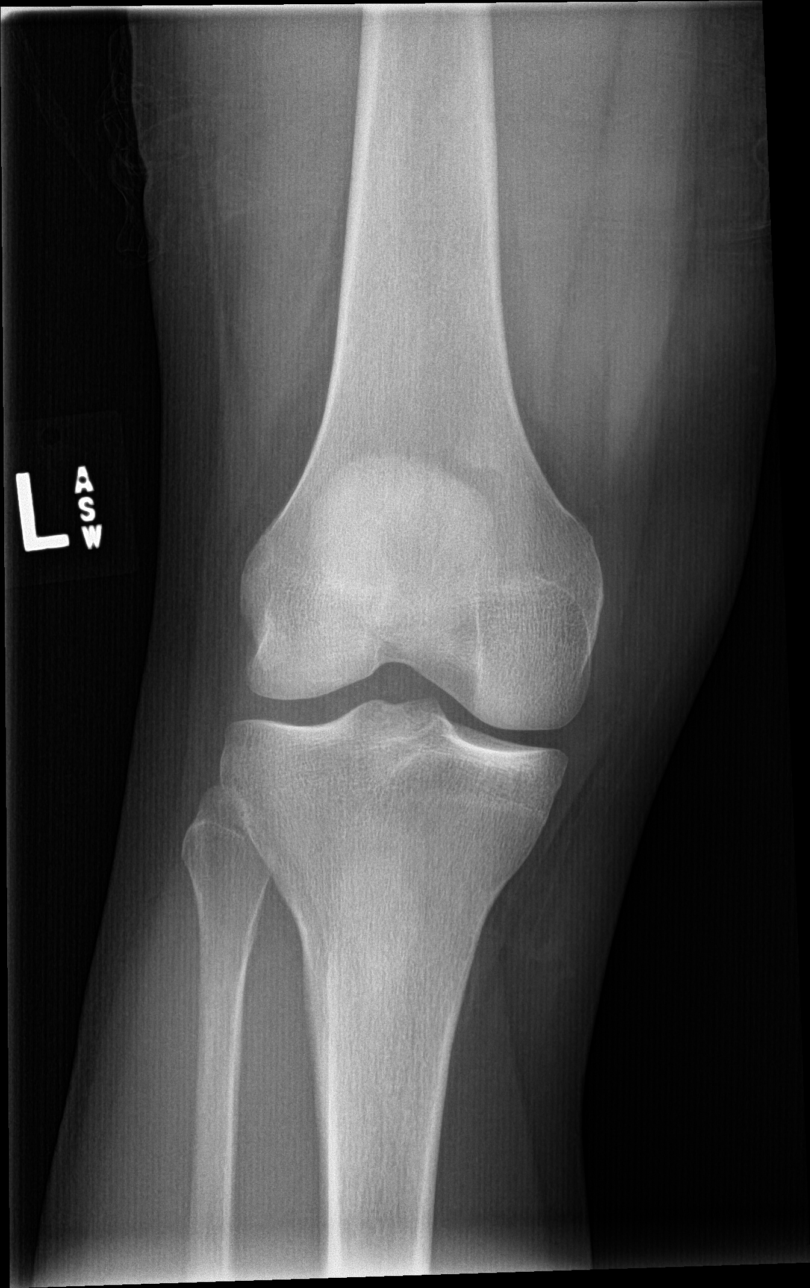

[patella skyline]
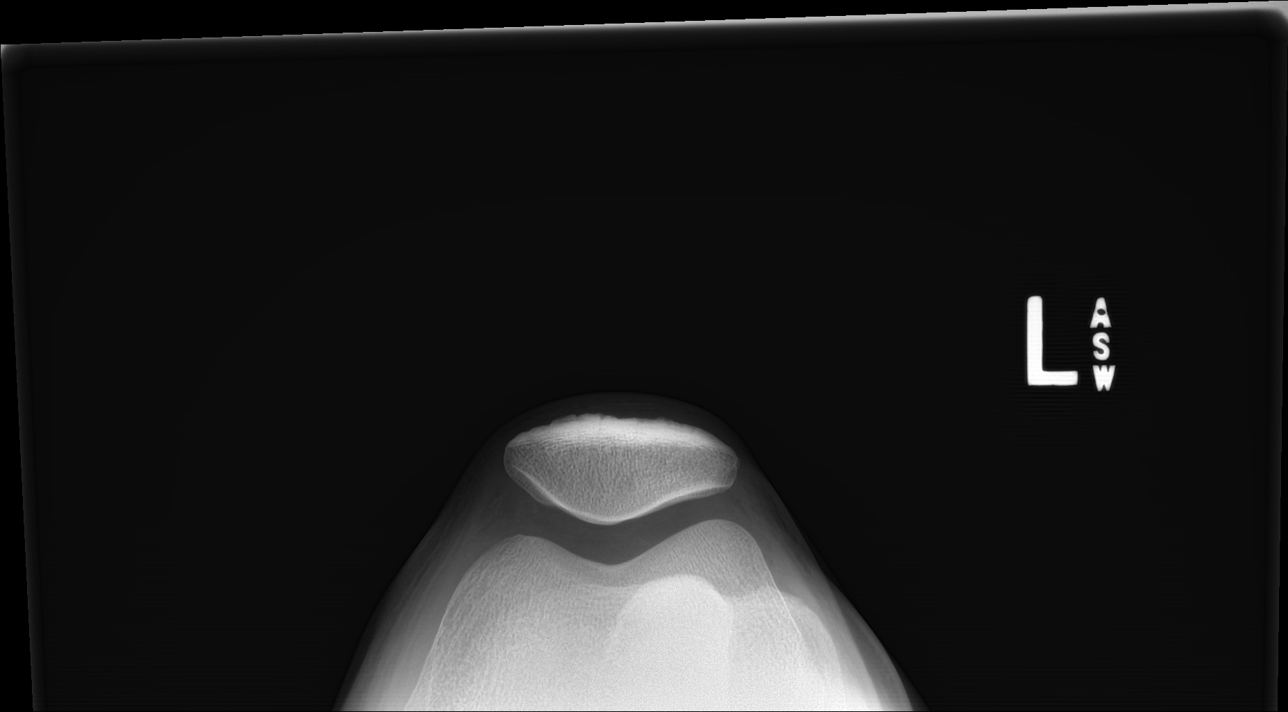

[knee lat]
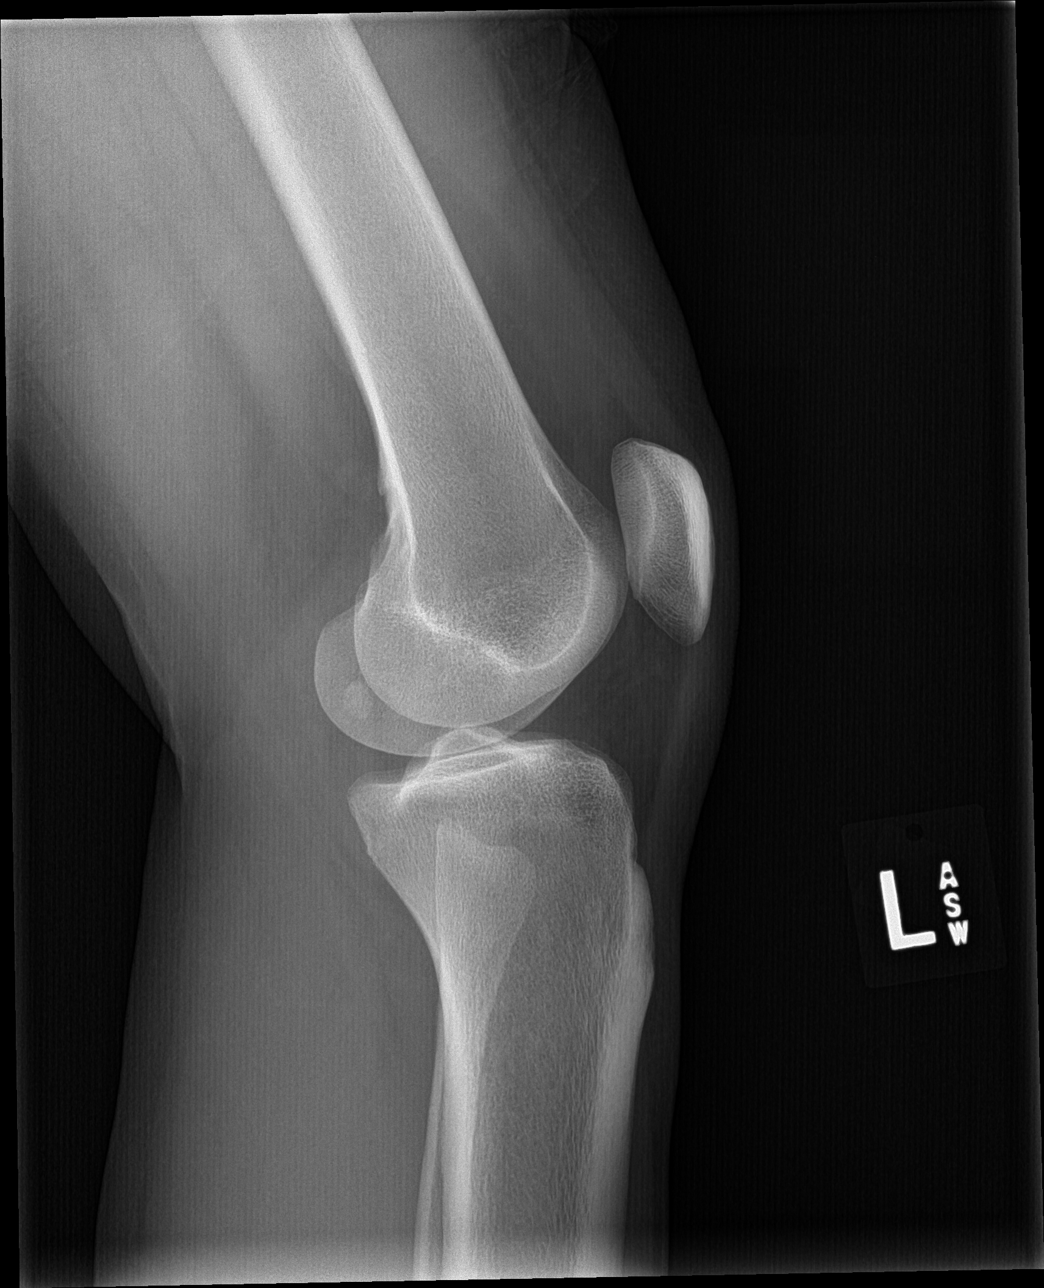

[4 of 4 positions shown; findings below may reference images not displayed]

FINDINGS: Upright frontal, tunnel, lateral, and sunrise patellar images were
obtained. No fracture or dislocation. No joint effusion. Joint
spaces appear unremarkable. No erosive change.
IMPRESSION: No evident fracture or joint effusion. No appreciable arthropathic
change.
